# Patient Record
Sex: Male | Born: 1965 | Race: White | Hispanic: No | Marital: Married | Smoking: Current every day smoker
Health system: Southern US, Community
[De-identification: ages and names within clinical notes are randomized; demographics above are authoritative.]

## PROBLEM LIST (undated history)

## (undated) DIAGNOSIS — K922 Gastrointestinal hemorrhage, unspecified: Secondary | ICD-10-CM

## (undated) DIAGNOSIS — K219 Gastro-esophageal reflux disease without esophagitis: Secondary | ICD-10-CM

---

## 2000-12-18 ENCOUNTER — Emergency Department (HOSPITAL_COMMUNITY): Admission: EM | Admit: 2000-12-18 | Discharge: 2000-12-18 | Payer: Self-pay

## 2013-02-28 ENCOUNTER — Emergency Department (HOSPITAL_COMMUNITY)
Admission: EM | Admit: 2013-02-28 | Discharge: 2013-02-28 | Disposition: A | Discharge status: Home / Self Care | Payer: Self-pay | Attending: Emergency Medicine | Admitting: Emergency Medicine

## 2013-02-28 ENCOUNTER — Emergency Department (HOSPITAL_COMMUNITY): Payer: Self-pay

## 2013-02-28 ENCOUNTER — Encounter (HOSPITAL_COMMUNITY): Payer: Self-pay | Admitting: Emergency Medicine

## 2013-02-28 DIAGNOSIS — Z79899 Other long term (current) drug therapy: Secondary | ICD-10-CM | POA: Insufficient documentation

## 2013-02-28 DIAGNOSIS — K219 Gastro-esophageal reflux disease without esophagitis: Secondary | ICD-10-CM | POA: Insufficient documentation

## 2013-02-28 DIAGNOSIS — F17200 Nicotine dependence, unspecified, uncomplicated: Secondary | ICD-10-CM | POA: Insufficient documentation

## 2013-02-28 DIAGNOSIS — M25562 Pain in left knee: Secondary | ICD-10-CM

## 2013-02-28 DIAGNOSIS — M25569 Pain in unspecified knee: Secondary | ICD-10-CM | POA: Insufficient documentation

## 2013-02-28 HISTORY — DX: Gastrointestinal hemorrhage, unspecified: K92.2

## 2013-02-28 HISTORY — DX: Gastro-esophageal reflux disease without esophagitis: K21.9

## 2013-02-28 MED ORDER — NAPROXEN 250 MG PO TABS
500.0000 mg | ORAL_TABLET | Freq: Once | ORAL | Status: AC
  Administered 2013-02-28: 500 mg via ORAL
  Filled 2013-02-28: qty 2

## 2013-02-28 MED ORDER — NAPROXEN 500 MG PO TABS: 500.0000 mg | ORAL_TABLET | Freq: Two times a day (BID) | ORAL | Status: AC

## 2013-02-28 NOTE — ED Provider Notes (Signed)
CSN: [id number]     Arrival date & time 02/28/13  0547 History   First MD Initiated Contact with Patient 02/28/13 [phone]     Chief Complaint  Patient presents with  . Knee Pain   (Consider location/radiation/quality/duration/timing/severity/associated sxs/prior Treatment) HPI Comments: 47 year old male who presents with left knee pain that has been ongoing for approximately one week. He denies any specific injuries but states that the pain was gradual in onset, persistent, gradually worsening and seems to be worse with movement including flexion of the knee. The pain is located on the medial surface of the knee joint on the left. There is no redness or swelling and he denies fevers or other joint abnormalities.  Patient is a 47 y.o. male presenting with knee pain. The history is provided by the patient.  Knee Pain Associated symptoms: no back pain and no fever     Past Medical History  Diagnosis Date  . Acid reflux   . GI bleed    History reviewed. No pertinent past surgical history. History reviewed. No pertinent family history. History  Substance Use Topics  . Smoking status: Current Every Day Smoker -- 1.00 packs/day  . Smokeless tobacco: Not on file  . Alcohol Use: No    Review of Systems  Constitutional: Negative for fever.  Musculoskeletal: Negative for back pain, joint swelling and myalgias.  Skin: Negative for rash.    Allergies  Review of patient's allergies indicates no known allergies.  Home Medications   Current Outpatient Rx  Name  Route  Sig  Dispense  Refill  . fish oil-omega-3 fatty acids 1000 MG capsule   Oral   Take 2 g by mouth daily.         Marland Kitchen omeprazole (PRILOSEC) 20 MG capsule   Oral   Take 20 mg by mouth 2 (two) times daily before a meal.         . naproxen (NAPROSYN) 500 MG tablet   Oral   Take 1 tablet (500 mg total) by mouth 2 (two) times daily with a meal.   30 tablet   0    BP 143/91  Pulse 68  Temp(Src) 98.2 F (36.8 C)  Resp  16  Ht 5\' 11"  (1.803 m)  Wt 225 lb (102.059 kg)  BMI 31.39 kg/m2  SpO2 98% Physical Exam  Nursing note and vitals reviewed. Constitutional: He appears well-developed and well-nourished. No distress.  HENT:  Head: Normocephalic and atraumatic.  Eyes: Conjunctivae are normal. No scleral icterus.  Cardiovascular: Normal rate, regular rhythm and intact distal pulses.   Pulmonary/Chest: Effort normal and breath sounds normal.  Musculoskeletal: He exhibits tenderness ( 10 a palpation over the medial joint line of the left knee, supple knee, soft compartments of the left lower extremity). He exhibits no edema.  Full range of motion of the hip knee and ankle on the left, no other significant joint abnormalities. There is pain with range of motion of the left knee  Neurological: He is alert.  Normal strength and sensation of the bilateral lower extremities  Skin: Skin is warm and dry. No rash noted. He is not diaphoretic.  No rash present over the left knee    ED Course  Procedures (including critical care time) Labs Review Labs Reviewed - No data to display Imaging Review Dg Knee Complete 4 Views Left  02/28/2013   EXAM: LEFT KNEE - COMPLETE 4+ VIEW  COMPARISON:  None.  FINDINGS: No acute fracture dislocation is identified. There is no  joint effusion. Mild degenerative spurring is present at the superior pole of the patella. There is mild spurring within the intercondylar eminences with mild narrowing of the medial joint space compartment.  No soft tissue abnormality. Osseous mineralization is normal.  IMPRESSION: 1. No acute fracture or dislocation. 2. Mild degenerative osteoarthrosis.   Electronically Signed   By: Rise Mu M.D.   On: 02/28/2013 06:54    EKG Interpretation   None       MDM   1. Knee pain, left    X-ray show that the patient has degenerative changes but no signs of fractures, he does not have a clinical or any radiographic effusion, no fever, no  tachycardia. It location of the tenderness and slight weakness with medial joint distress suggests a MCL tear or partial tear. Knee immobilizer, Rice therapy, follow up with orthopedics.   Meds given in ED:  Medications  naproxen (NAPROSYN) tablet 500 mg (not administered)    New Prescriptions   NAPROXEN (NAPROSYN) 500 MG TABLET    Take 1 tablet (500 mg total) by mouth 2 (two) times daily with a meal.        Vida Roller, MD 02/28/13 [phone]

## 2013-02-28 NOTE — ED Notes (Signed)
Pt reporting pain in left knee for about 1 week.  No known injury.  Reports exacerbated by walking a lot during the day.

## 2013-05-27 ENCOUNTER — Encounter (HOSPITAL_COMMUNITY): Payer: Self-pay | Admitting: Emergency Medicine

## 2013-05-27 ENCOUNTER — Emergency Department (HOSPITAL_COMMUNITY)
Admission: EM | Admit: 2013-05-27 | Discharge: 2013-05-27 | Disposition: A | Discharge status: Home / Self Care | Payer: Self-pay | Attending: Emergency Medicine | Admitting: Emergency Medicine

## 2013-05-27 DIAGNOSIS — Z79899 Other long term (current) drug therapy: Secondary | ICD-10-CM | POA: Insufficient documentation

## 2013-05-27 DIAGNOSIS — Z791 Long term (current) use of non-steroidal anti-inflammatories (NSAID): Secondary | ICD-10-CM | POA: Insufficient documentation

## 2013-05-27 DIAGNOSIS — K219 Gastro-esophageal reflux disease without esophagitis: Secondary | ICD-10-CM | POA: Insufficient documentation

## 2013-05-27 DIAGNOSIS — F17200 Nicotine dependence, unspecified, uncomplicated: Secondary | ICD-10-CM | POA: Insufficient documentation

## 2013-05-27 DIAGNOSIS — H109 Unspecified conjunctivitis: Secondary | ICD-10-CM | POA: Insufficient documentation

## 2013-05-27 MED ORDER — ERYTHROMYCIN 5 MG/GM OP OINT
TOPICAL_OINTMENT | Freq: Four times a day (QID) | OPHTHALMIC | Status: DC
  Administered 2013-05-27: 08:00:00 via OPHTHALMIC
  Filled 2013-05-27: qty 3.5

## 2013-05-27 MED ORDER — TETRACAINE HCL 0.5 % OP SOLN
2.0000 [drp] | Freq: Once | OPHTHALMIC | Status: AC
  Administered 2013-05-27: 2 [drp] via OPHTHALMIC
  Filled 2013-05-27: qty 2

## 2013-05-27 MED ORDER — FLUORESCEIN SODIUM 1 MG OP STRP
1.0000 | ORAL_STRIP | Freq: Once | OPHTHALMIC | Status: AC
  Administered 2013-05-27: 1 via OPHTHALMIC
  Filled 2013-05-27: qty 1

## 2013-05-27 NOTE — ED Provider Notes (Signed)
CSN: [id number]     Arrival date & time 05/27/13  0650 History   First MD Initiated Contact with Patient 05/27/13 0719     Chief Complaint  Patient presents with  . Eye Problem    Patient is a 48 y.o. male presenting with eye problem. The history is provided by the patient.  Eye Problem Location:  R eye Quality:  Foreign body sensation Severity:  Mild Onset quality:  Gradual Duration:  2 days Timing:  Constant Progression:  Unchanged Chronicity:  New Relieved by:  Nothing Worsened by:  Nothing tried Associated symptoms: blurred vision and discharge   Associated symptoms: no photophobia and no vomiting     Past Medical History  Diagnosis Date  . Acid reflux   . GI bleed    History reviewed. No pertinent past surgical history. No family history on file. History  Substance Use Topics  . Smoking status: Current Every Day Smoker -- 1.00 packs/day  . Smokeless tobacco: Not on file  . Alcohol Use: No    Review of Systems  Constitutional: Negative for fever.  Eyes: Positive for blurred vision and discharge. Negative for photophobia.       Denies visual loss   Gastrointestinal: Negative for vomiting.    Allergies  Review of patient's allergies indicates no known allergies.  Home Medications   Current Outpatient Rx  Name  Route  Sig  Dispense  Refill  . fish oil-omega-3 fatty acids 1000 MG capsule   Oral   Take 2 g by mouth daily.         . naproxen (NAPROSYN) 500 MG tablet   Oral   Take 1 tablet (500 mg total) by mouth 2 (two) times daily with a meal.   30 tablet   0   . omeprazole (PRILOSEC) 20 MG capsule   Oral   Take 20 mg by mouth 2 (two) times daily before a meal.          BP 122/79  Pulse 76  Temp(Src) 98.3 F (36.8 C) (Oral)  Resp 16  SpO2 99% Physical Exam CONSTITUTIONAL: Well developed/well nourished HEAD: Normocephalic/atraumatic EYES: EOMI/PERRL, OS - no abnormalities OD - conjunctival erythema, no significant discharge noted, no  proptosis, mild edema of eyelid, no corneal hazing, no foreign body noted, no large abrasion noted, no consensual pain Visual acuity noted ENMT: Mucous membranes moist NECK: supple no meningeal signs SPINE:entire spine nontender CV: S1/S2 noted, no murmurs/rubs/gallops noted LUNGS: Lungs are clear to auscultation bilaterally, no apparent distress ABDOMEN: soft, nontender, no rebound or guarding NEURO: Pt is awake/alert, moves all extremitiesx4 EXTREMITIES: pulses normal, full ROM SKIN: warm, color normal PSYCH: no abnormalities of mood noted  ED Course  Procedures (including critical care time) Labs Review Labs Reviewed - No data to display Imaging Review No results found.  EKG Interpretation   None       MDM   1. Conjunctivitis    Nursing notes including past medical history and social history reviewed and considered in documentation  Will place on erythromycin ointment Swelling started two days ago, but thought he got something in the eye last week though no FB noted Advised f/u with ophtho if no improvement next week    Joya Gaskinsonald W Rohn Fritsch, MD 05/27/13 [id number]

## 2013-05-27 NOTE — ED Notes (Signed)
Pt states he thinks he may have gotten something in his eye last week. Pt states eye began swelling 2days ago with redness. Pt also states drainage through the eye overnight.

## 2013-05-27 NOTE — Discharge Instructions (Signed)

## 2014-09-22 IMAGING — CR DG KNEE COMPLETE 4+V*L*
4 series · 4 of 4 positions shown · non-contrast
Comparison: None.

EXAM:
LEFT KNEE - COMPLETE 4+ VIEW

[view not recorded (1 of 4)]
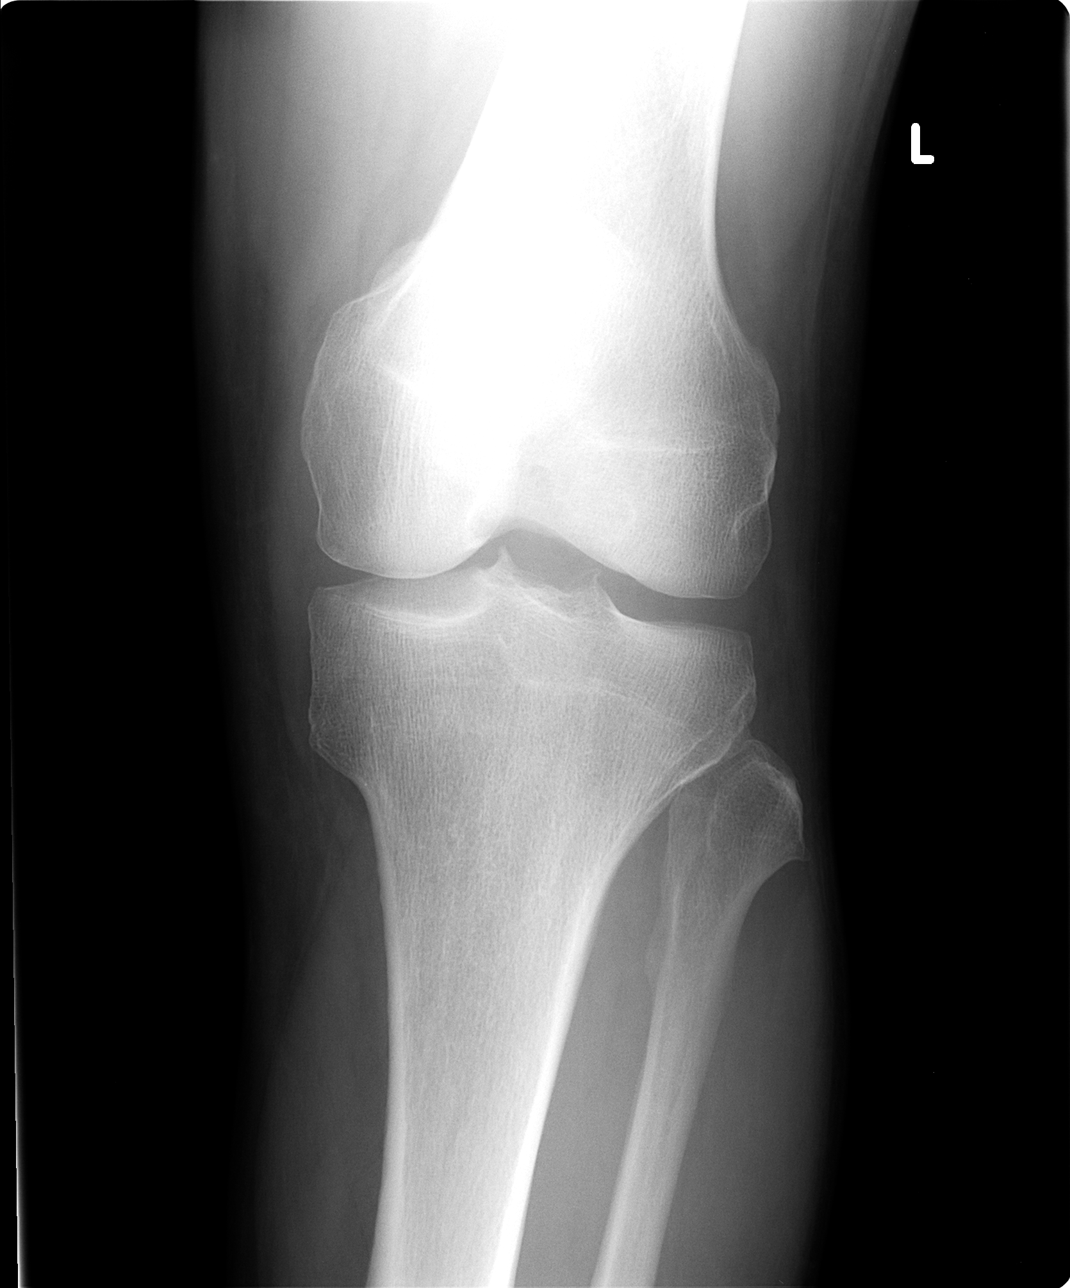

[view not recorded (2 of 4)]
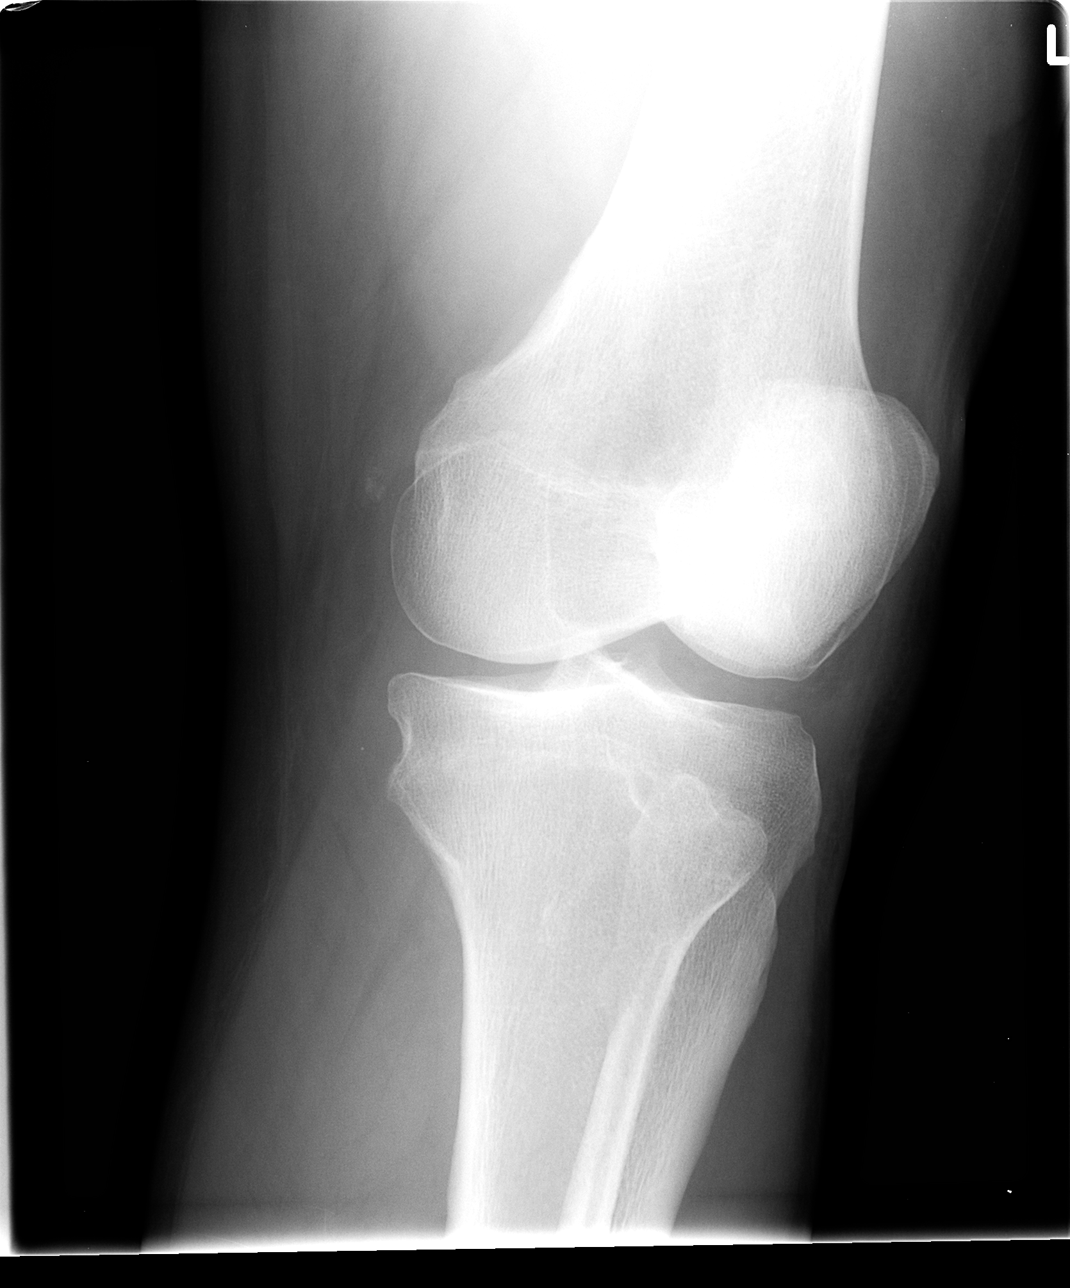

[view not recorded (3 of 4)]
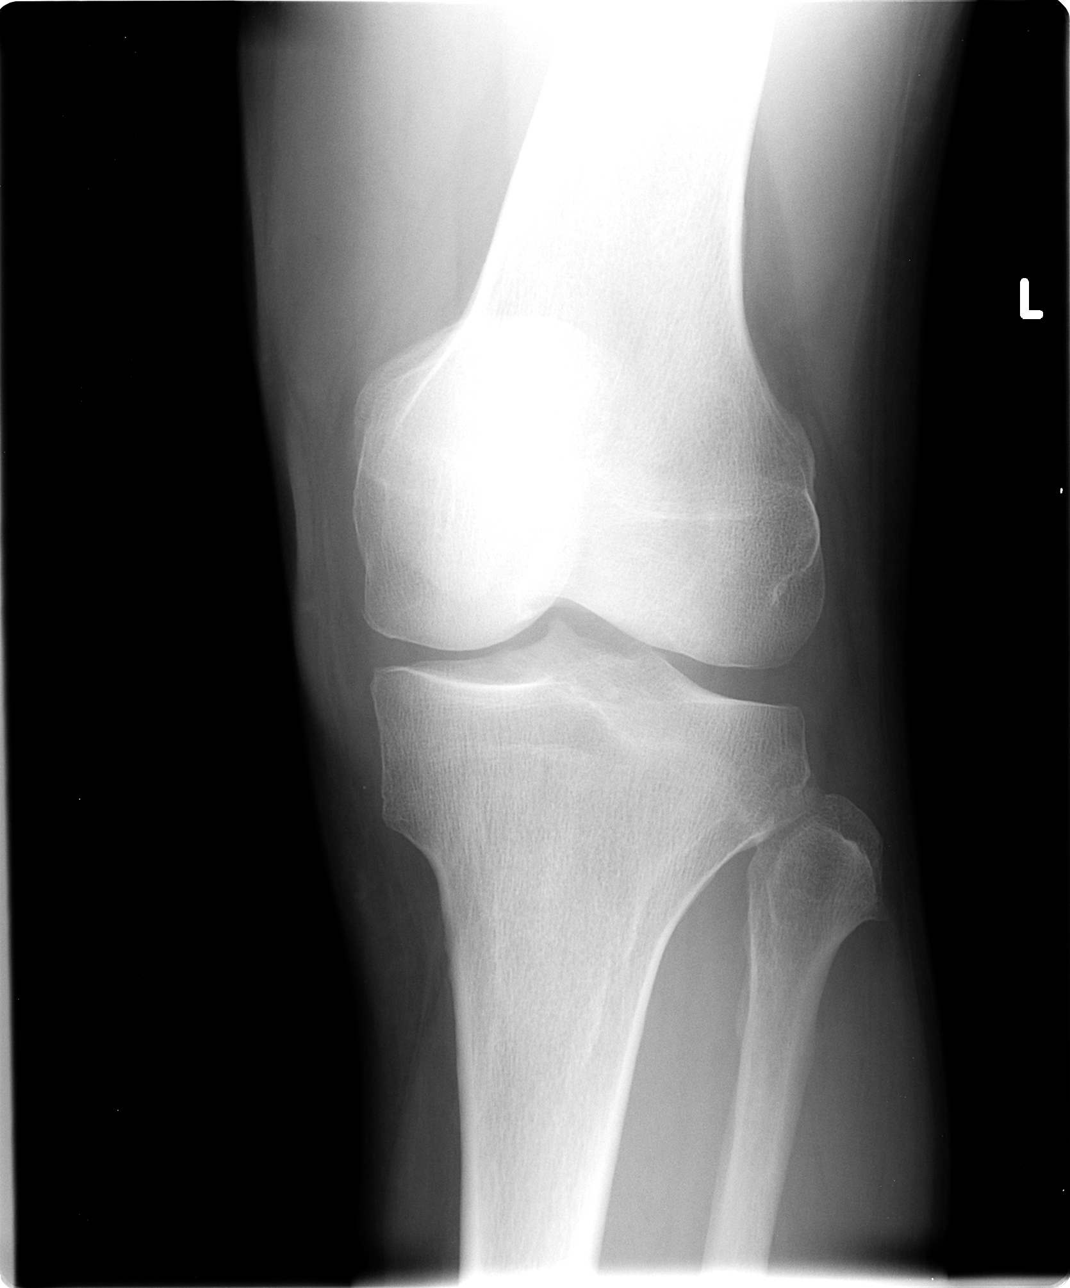

[view not recorded (4 of 4)]
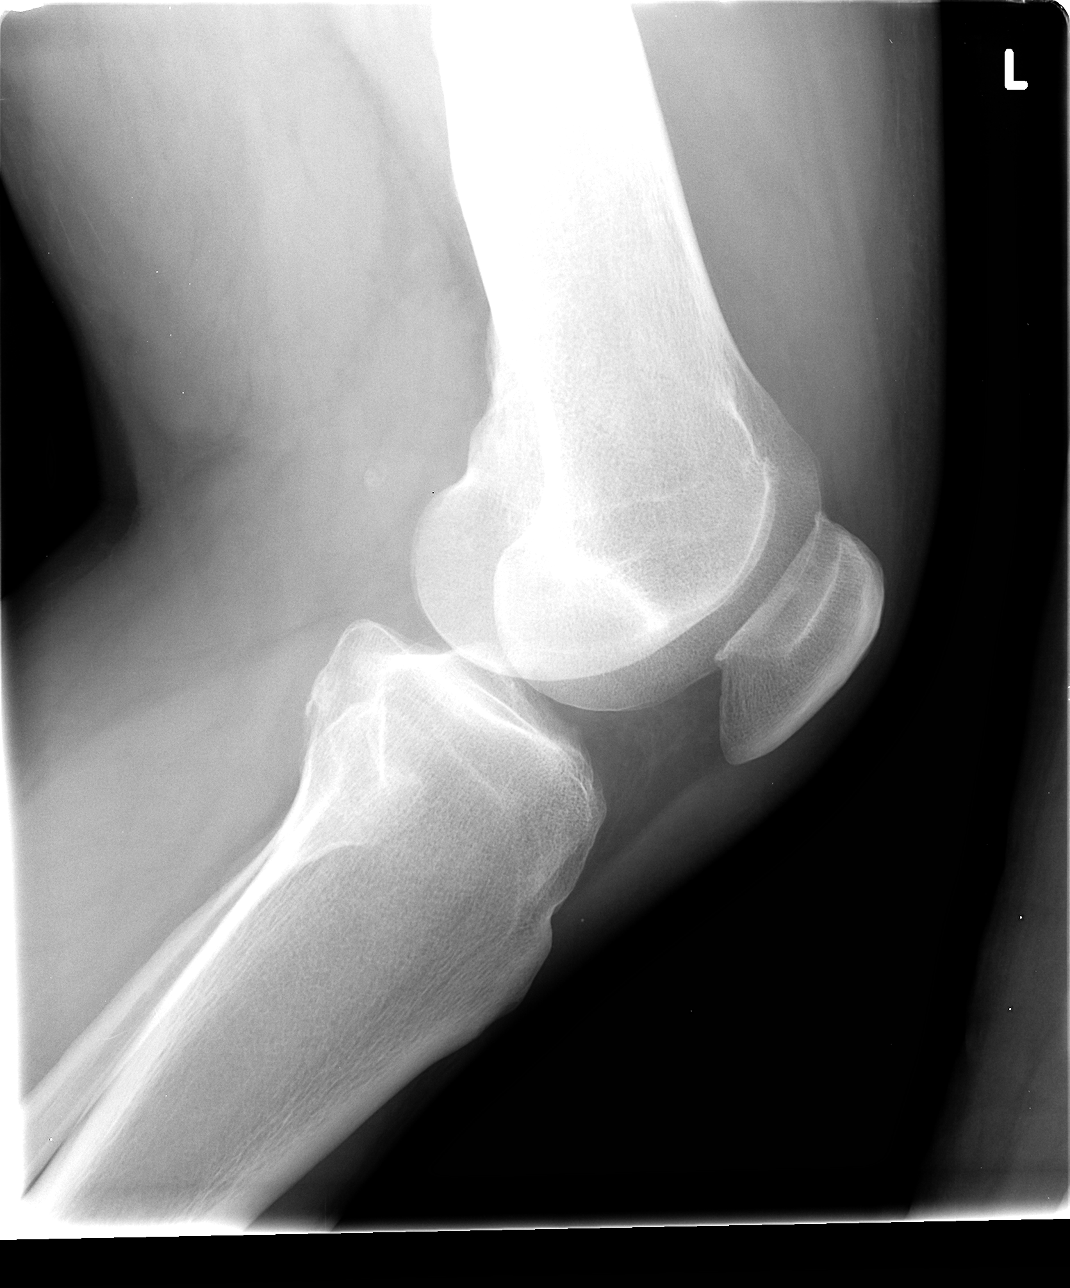

[4 of 4 positions shown; findings below may reference images not displayed]

FINDINGS: No acute fracture dislocation is identified. There is no joint
effusion. Mild degenerative spurring is present at the superior pole
of the patella. There is mild spurring within the intercondylar
eminences with mild narrowing of the medial joint space compartment.

No soft tissue abnormality. Osseous mineralization is normal.
IMPRESSION: 1. No acute fracture or dislocation.
2. Mild degenerative osteoarthrosis.
